# Patient Record
Sex: Male | Born: 1995 | Race: Black or African American | Hispanic: No | Marital: Single | Smoking: Never smoker
Health system: Southern US, Community
[De-identification: ages and names within clinical notes are randomized; demographics above are authoritative.]

## PROBLEM LIST (undated history)

## (undated) DIAGNOSIS — M300 Polyarteritis nodosa: Secondary | ICD-10-CM

## (undated) DIAGNOSIS — I10 Essential (primary) hypertension: Secondary | ICD-10-CM

## (undated) DIAGNOSIS — I517 Cardiomegaly: Secondary | ICD-10-CM

---
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
## 2017-02-25 ENCOUNTER — Encounter (HOSPITAL_BASED_OUTPATIENT_CLINIC_OR_DEPARTMENT_OTHER): Payer: Self-pay | Admitting: Emergency Medicine

## 2017-02-25 ENCOUNTER — Emergency Department (HOSPITAL_BASED_OUTPATIENT_CLINIC_OR_DEPARTMENT_OTHER)
Admission: EM | Admit: 2017-02-25 | Discharge: 2017-02-25 | Disposition: A | Discharge status: Home / Self Care | Payer: BC Managed Care – PPO | Attending: Emergency Medicine | Admitting: Emergency Medicine

## 2017-02-25 DIAGNOSIS — G43909 Migraine, unspecified, not intractable, without status migrainosus: Secondary | ICD-10-CM | POA: Diagnosis not present

## 2017-02-25 DIAGNOSIS — G43809 Other migraine, not intractable, without status migrainosus: Secondary | ICD-10-CM

## 2017-02-25 DIAGNOSIS — I10 Essential (primary) hypertension: Secondary | ICD-10-CM | POA: Diagnosis present

## 2017-02-25 MED ORDER — SODIUM CHLORIDE 0.9 % IV BOLUS (SEPSIS)
500.0000 mL | Freq: Once | INTRAVENOUS | Status: AC
  Administered 2017-02-25: 500 mL via INTRAVENOUS

## 2017-02-25 MED ORDER — PROCHLORPERAZINE EDISYLATE 5 MG/ML IJ SOLN: 10.0000 mg | Freq: Four times a day (QID) | INTRAMUSCULAR | Status: DC | PRN

## 2017-02-25 MED ORDER — KETOROLAC TROMETHAMINE 30 MG/ML IJ SOLN
30.0000 mg | Freq: Once | INTRAMUSCULAR | Status: AC
  Administered 2017-02-25: 30 mg via INTRAVENOUS

## 2017-02-25 MED ORDER — ONDANSETRON HCL 4 MG/2ML IJ SOLN
4.0000 mg | Freq: Once | INTRAMUSCULAR | Status: AC
  Administered 2017-02-25: 4 mg via INTRAVENOUS

## 2017-02-25 NOTE — Discharge Instructions (Signed)
Please follow-up with an optometrist to have your vision evaluated

## 2017-02-25 NOTE — ED Triage Notes (Signed)
Pt c/o headache x 1 week with nausea and photophobia. Pt does not have hx of migraines.

## 2017-02-25 NOTE — ED Notes (Signed)
Pt verbalizes understanding of d/c instructions and denies any further needs at this time. 

## 2017-02-25 NOTE — ED Provider Notes (Signed)
[facility] Provider Note   CSN: [id number] Arrival date & time: 02/25/17  0106     History   Chief Complaint Chief Complaint  Patient presents with  . Headache    HPI Jonathan Barron is a 21 y.o. male.  HPI Patient is a 21 year old male presents emergency department approximately one week of headache and nausea and photophobia.  No prior history of significant headaches.  Denies fevers or chills.  No change in his vision.  Denies neck pain or stiffness.  No medication prior to arrival.  No altered mental status per family.  Symptoms are mild to moderate in severity.   Past Medical History:  Diagnosis Date  . Enlarged heart   . Hypertension   . Polyarteritis nodosa (HCC)    Strep induced, treated at [facility], now in remission    There are no active problems to display for this patient.   History reviewed. No pertinent surgical history.     Home Medications    Prior to Admission medications   Medication Sig Start Date End Date Taking? Authorizing Provider  aspirin-acetaminophen-caffeine (EXCEDRIN MIGRAINE) [phone] MG tablet Take by mouth.   Yes [provider]    Family History No family history on file.  Social History Social History  Substance Use Topics  . Smoking status: Never Smoker  . Smokeless tobacco: Never Used  . Alcohol use No     Allergies   Patient has no known allergies.   Review of Systems Review of Systems  All other systems reviewed and are negative.    Physical Exam Updated Vital Signs BP (!) 133/100 (BP Location: Left Arm)   Pulse (!) 58   Temp 97.9 F (36.6 C) (Oral)   Resp 20   Ht  (1.727 m)   Wt 64.5 kg (142 lb 3.2 oz)   SpO2 100%   BMI 21.62 kg/m   Physical Exam  Constitutional: He is oriented to person, place, and time. He appears well-developed and well-nourished.  HENT:  Head: Normocephalic and atraumatic.  Eyes: Pupils are equal, round, and reactive to light. EOM are normal.  Neck:  Normal range of motion.  Cardiovascular: Regular rhythm.   Pulmonary/Chest: Effort normal.  Abdominal: Soft. He exhibits no distension.  Musculoskeletal: Normal range of motion.  Neurological: He is alert and oriented to person, place, and time.  5/5 strength in major muscle groups of  bilateral upper and lower extremities. Speech normal. No facial asymetry.   Psychiatric: He has a normal mood and affect.  Nursing note and vitals reviewed.    ED Treatments / Results  Labs (all labs ordered are listed, but only abnormal results are displayed) Labs Reviewed - No data to display  EKG  EKG Interpretation None       Radiology No results found.  Procedures Procedures (including critical care time)  Medications Ordered in ED Medications  prochlorperazine (COMPAZINE) injection 10 mg (not administered)  ketorolac (TORADOL) 30 MG/ML injection 30 mg (30 mg Intravenous Given 02/25/17 0205)  ondansetron (ZOFRAN) injection 4 mg (4 mg Intravenous Given 02/25/17 0206)  sodium chloride 0.9 % bolus 500 mL (500 mLs Intravenous New Bag/Given 02/25/17 0204)     Initial Impression / Assessment and Plan / ED Course  I have reviewed the triage vital signs and the nursing notes.  Pertinent labs & imaging results that were available during my care of the patient were reviewed by me and considered in my medical decision making (see chart for details).  Well-appearing.  Sounds like migraine headache.  Doubt intracranial mass.  Improvement in his symptoms while in the emergency department.  Nonfocal neurologic exam.  Outpatient primary care follow-up.  I've asked that he follow-up with the neurologist to be continued having headaches.  Given his young age of also asked that he follow-up with an optometrist to have his vision checked   Final Clinical Impressions(s) / ED Diagnoses   Final diagnoses:  Other migraine without status migrainosus, not intractable    New Prescriptions New  Prescriptions   No medications on file     Azalia Bilisampos, Celia Friedland, MD 02/25/17 [phone]

## 2017-07-30 DIAGNOSIS — Z79899 Other long term (current) drug therapy: Secondary | ICD-10-CM | POA: Insufficient documentation

## 2017-07-30 DIAGNOSIS — H9201 Otalgia, right ear: Secondary | ICD-10-CM | POA: Diagnosis present

## 2017-07-30 DIAGNOSIS — I10 Essential (primary) hypertension: Secondary | ICD-10-CM | POA: Insufficient documentation

## 2017-07-30 DIAGNOSIS — H6123 Impacted cerumen, bilateral: Secondary | ICD-10-CM | POA: Diagnosis not present

## 2017-07-31 ENCOUNTER — Other Ambulatory Visit: Payer: Self-pay

## 2017-07-31 ENCOUNTER — Emergency Department (HOSPITAL_BASED_OUTPATIENT_CLINIC_OR_DEPARTMENT_OTHER)
Admission: EM | Admit: 2017-07-31 | Discharge: 2017-07-31 | Disposition: A | Discharge status: Home / Self Care | Payer: Medicaid Other | Attending: Emergency Medicine | Admitting: Emergency Medicine

## 2017-07-31 ENCOUNTER — Encounter (HOSPITAL_BASED_OUTPATIENT_CLINIC_OR_DEPARTMENT_OTHER): Payer: Self-pay | Admitting: Emergency Medicine

## 2017-07-31 DIAGNOSIS — H6123 Impacted cerumen, bilateral: Secondary | ICD-10-CM

## 2017-07-31 NOTE — Discharge Instructions (Signed)
Return to the emergency department for any new or concerning symptoms.

## 2017-07-31 NOTE — ED Provider Notes (Signed)
[facility] EMERGENCY DEPARTMENT Provider Note   CSN: [id number] Arrival date & time: 07/30/17  2352     History   Chief Complaint Chief Complaint  Patient presents with  . Otalgia    HPI Jonathan Barron is a 22 y.o. male.  Patient is a 22 year old male presenting with complaints of right ear pain.  He states that when he got out of the shower this evening he had pain in his right ear along with muffled hearing.  He denies any recent fevers or chills, cough, or congestion.   The history is provided by the patient.  Otalgia  This is a new problem. The current episode started 1 to 2 hours ago. There is pain in the right ear. The problem occurs constantly. The problem has not changed since onset.There has been no fever. The pain is moderate.    Past Medical History:  Diagnosis Date  . Enlarged heart   . Hypertension   . Polyarteritis nodosa (HCC)    Strep induced, treated at [facility], now in remission    There are no active problems to display for this patient.   History reviewed. No pertinent surgical history.     Home Medications    Prior to Admission medications   Medication Sig Start Date End Date Taking? Authorizing Provider  aspirin-acetaminophen-caffeine (EXCEDRIN MIGRAINE) [phone] MG tablet Take by mouth.    [provider]    Family History No family history on file.  Social History Social History   Tobacco Use  . Smoking status: Never Smoker  . Smokeless tobacco: Never Used  Substance Use Topics  . Alcohol use: No  . Drug use: No     Allergies   Patient has no known allergies.   Review of Systems Review of Systems  HENT: Positive for ear pain.   All other systems reviewed and are negative.    Physical Exam Updated Vital Signs Pulse 75   Temp 98.6 F (37 C) (Oral)   Resp 17   Ht 5\' 8"  (1.727 m)   Wt 61.2 kg (135 lb)   SpO2 100%   BMI 20.53 kg/m   Physical Exam  Constitutional: He is oriented to person,  place, and time. He appears well-developed and well-nourished. No distress.  HENT:  Head: Normocephalic and atraumatic.  Mouth/Throat: Oropharynx is clear and moist.  There appears to be a cerumen impaction of the right ear canal.  Neck: Normal range of motion. Neck supple.  Pulmonary/Chest: Effort normal.  Neurological: He is alert and oriented to person, place, and time.  Skin: He is not diaphoretic.  Nursing note and vitals reviewed.    ED Treatments / Results  Labs (all labs ordered are listed, but only abnormal results are displayed) Labs Reviewed - No data to display  EKG  EKG Interpretation None       Radiology No results found.  Procedures Procedures (including critical care time)  Medications Ordered in ED Medications - No data to display   Initial Impression / Assessment and Plan / ED Course  I have reviewed the triage vital signs and the nursing notes.  Pertinent labs & imaging results that were available during my care of the patient were reviewed by me and considered in my medical decision making (see chart for details).  Patient presenting with right ear pain and muffled hearing.  He has bilateral cerumen impactions.  These were irrigated with water and peroxide solution with extraction of a large amount of earwax.  This has resolved his symptoms.  There is no evidence for infection and I believe appropriate for discharge.  Final Clinical Impressions(s) / ED Diagnoses   Final diagnoses:  None    ED Discharge Orders    None       Geoffery Lyons, MD 07/31/17 [phone]

## 2017-07-31 NOTE — ED Notes (Signed)
EDP into room 

## 2017-07-31 NOTE — ED Triage Notes (Signed)
C/o R ear pain that started today. Also reports muffled hearing from that ear. Denies other sx.

## 2017-07-31 NOTE — ED Notes (Signed)
Pt reports "hear better, feel better".

## 2017-07-31 NOTE — ED Notes (Signed)
EDP innto room at time of d/c. Pt denies questions or needs.

## 2019-12-27 ENCOUNTER — Encounter (HOSPITAL_COMMUNITY): Payer: Self-pay

## 2019-12-27 ENCOUNTER — Other Ambulatory Visit: Payer: Self-pay

## 2019-12-27 ENCOUNTER — Ambulatory Visit (HOSPITAL_COMMUNITY)
Admission: EM | Admit: 2019-12-27 | Discharge: 2019-12-27 | Disposition: A | Discharge status: Home / Self Care | Payer: Medicaid Other | Attending: Physician Assistant | Admitting: Physician Assistant

## 2019-12-27 DIAGNOSIS — S00451A Superficial foreign body of right ear, initial encounter: Secondary | ICD-10-CM | POA: Diagnosis not present

## 2019-12-27 MED ORDER — CEPHALEXIN 500 MG PO CAPS: 500.0000 mg | ORAL_CAPSULE | Freq: Four times a day (QID) | ORAL | 0 refills | Status: AC

## 2019-12-27 NOTE — Discharge Instructions (Addendum)
I am not able to remove the ear ring in Urgent care  Call the plastic surgery group to have this removed  Take the keflex 4 times a day until evaluated

## 2019-12-27 NOTE — ED Triage Notes (Signed)
Pt reports having earring back stuck in R ear x1 week. No other concerns at this time.

## 2019-12-27 NOTE — ED Provider Notes (Signed)
[facility]    CSN: [id number] Arrival date & time: 12/27/19  1505      History   Chief Complaint Chief Complaint  Patient presents with  . Ear Pain    HPI Jonathan Barron is a 24 y.o. male.   Patient presents urgent care for evaluation of ear ringing second year.  Reports hearing has the back on and is stuck.  He reports he first noticed this being stuck Wednesday this week.  He reports he was able to get the left earring out however the right he is able to take out.  Reports the stud is bent.  Reports is not very painful.  Is not any discharge.  Earrings have been in for a while.       Past Medical History:  Diagnosis Date  . Enlarged heart   . Hypertension   . Polyarteritis nodosa (HCC)    Strep induced, treated at [facility], now in remission    There are no problems to display for this patient.   History reviewed. No pertinent surgical history.     Home Medications    Prior to Admission medications   Medication Sig Start Date End Date Taking? Authorizing Provider  aspirin-acetaminophen-caffeine (EXCEDRIN MIGRAINE) [phone] MG tablet Take by mouth.    [provider]  cephALEXin (KEFLEX) 500 MG capsule Take 1 capsule (500 mg total) by mouth 4 (four) times daily. 12/27/19   Shell Yandow, Veryl Speak, PA-C    Family History No family history on file.  Social History Social History   Tobacco Use  . Smoking status: Never Smoker  . Smokeless tobacco: Never Used  Vaping Use  . Vaping Use: Never used  Substance Use Topics  . Alcohol use: No  . Drug use: No     Allergies   Patient has no known allergies.   Review of Systems Review of Systems   Physical Exam Triage Vital Signs ED Triage Vitals [12/27/19 1615]  Enc Vitals Group     BP 129/87     Pulse Rate 76     Resp 18     Temp 98.8 F (37.1 C)     Temp Source Oral     SpO2 100 %     Weight 145 lb (65.8 kg)     Height 5\' 9"  (1.753 m)     Head Circumference      Peak Flow      Pain  Score 1     Pain Loc      Pain Edu?      Excl. in GC?    No data found.  Updated Vital Signs BP 129/87   Pulse 76   Temp 98.8 F (37.1 C) (Oral)   Resp 18   Ht 5\' 9"  (1.753 m)   Wt 145 lb (65.8 kg)   SpO2 100%   BMI 21.41 kg/m   Visual Acuity Right Eye Distance:   Left Eye Distance:   Bilateral Distance:    Right Eye Near:   Left Eye Near:    Bilateral Near:     Physical Exam Vitals and nursing note reviewed.  HENT:     Ears:     Comments: Right ear with earring in place.  Stud shaft is bent with front of hearing intact, screw on back in place.  There is skin from the ear growing through the holes of scar on back.  Mild swelling but no discharge.     UC Treatments / Results  Labs (  all labs ordered are listed, but only abnormal results are displayed) Labs Reviewed - No data to display  EKG   Radiology No results found.  Procedures Procedures (including critical care time)  Attempted manipulation and removal of the back of the earring however given skin protruding through holes and hearing being burrowed into the soft tissue the ear was not able to move.  Did not feel procedure urgent care would be curative. Medications Ordered in UC Medications - No data to display  Initial Impression / Assessment and Plan / UC Course  I have reviewed the triage vital signs and the nursing notes.  Pertinent labs & imaging results that were available during my care of the patient were reviewed by me and considered in my medical decision making (see chart for details).     #Foreign body right earlobe Patient is a 24 year old presenting with retained earring with back on and right ear.  Given the shaft is bent, skin growth around and being buried into tissue that is to be too complex to urgent care management.  Though no active infection, will place on Keflex prophylactically for 5 days.  Recommended follow-up with plastic surgery for removal.  Patient verbalized agreement  understanding plan. Final Clinical Impressions(s) / UC Diagnoses   Final diagnoses:  Foreign body of right ear lobe, initial encounter     Discharge Instructions     I am not able to remove the ear ring in Urgent care  Call the plastic surgery group to have this removed  Take the keflex 4 times a day until evaluated    ED Prescriptions    Medication Sig Dispense Auth. Provider   cephALEXin (KEFLEX) 500 MG capsule Take 1 capsule (500 mg total) by mouth 4 (four) times daily. 20 capsule Jevon Shells, Veryl Speak, PA-C     PDMP not reviewed this encounter.   Hermelinda Medicus, PA-C 12/27/19 2321
